# Patient Record
Sex: Male | Born: 1970 | Race: Black or African American | Hispanic: No | Marital: Married
Health system: Southern US, Community
[De-identification: ages and names within clinical notes are randomized; demographics above are authoritative.]

---

## 2005-02-19 ENCOUNTER — Emergency Department: Payer: Self-pay | Admitting: Emergency Medicine

## 2005-12-25 ENCOUNTER — Ambulatory Visit: Payer: Self-pay | Admitting: Internal Medicine

## 2006-06-23 ENCOUNTER — Ambulatory Visit: Payer: Self-pay | Admitting: Internal Medicine

## 2006-08-18 ENCOUNTER — Ambulatory Visit: Payer: Self-pay | Admitting: Internal Medicine

## 2006-08-27 ENCOUNTER — Ambulatory Visit: Payer: Self-pay | Admitting: Gastroenterology

## 2006-09-09 ENCOUNTER — Ambulatory Visit: Payer: Self-pay | Admitting: Gastroenterology

## 2006-09-09 ENCOUNTER — Encounter: Payer: Self-pay | Admitting: Internal Medicine

## 2006-10-05 ENCOUNTER — Emergency Department (HOSPITAL_COMMUNITY): Admission: EM | Admit: 2006-10-05 | Discharge: 2006-10-05 | Payer: Self-pay | Admitting: *Deleted

## 2006-11-20 ENCOUNTER — Encounter: Payer: Self-pay | Admitting: Internal Medicine

## 2007-09-01 ENCOUNTER — Ambulatory Visit: Payer: Self-pay | Admitting: *Deleted

## 2007-09-01 ENCOUNTER — Inpatient Hospital Stay (HOSPITAL_COMMUNITY): Admission: AD | Admit: 2007-09-01 | Discharge: 2007-09-07 | Payer: Self-pay | Admitting: *Deleted

## 2007-09-01 ENCOUNTER — Emergency Department (HOSPITAL_COMMUNITY): Admission: EM | Admit: 2007-09-01 | Discharge: 2007-09-01 | Payer: Self-pay | Admitting: Emergency Medicine

## 2007-09-08 ENCOUNTER — Other Ambulatory Visit (HOSPITAL_COMMUNITY): Admission: RE | Admit: 2007-09-08 | Discharge: 2007-11-09 | Payer: Self-pay | Admitting: Psychiatry

## 2007-09-09 ENCOUNTER — Ambulatory Visit: Payer: Self-pay | Admitting: Psychiatry

## 2007-09-16 ENCOUNTER — Ambulatory Visit: Payer: Self-pay | Admitting: Psychiatry

## 2007-10-24 ENCOUNTER — Encounter: Payer: Self-pay | Admitting: Internal Medicine

## 2007-10-25 ENCOUNTER — Ambulatory Visit: Payer: Self-pay | Admitting: Internal Medicine

## 2007-11-05 ENCOUNTER — Ambulatory Visit: Payer: Self-pay | Admitting: Internal Medicine

## 2007-11-05 LAB — CONVERTED CEMR LAB
Bilirubin Urine: NEGATIVE
Ketones, urine, test strip: NEGATIVE
Nitrite: NEGATIVE
Specific Gravity, Urine: >=1.03
WBC Urine, dipstick: NEGATIVE

## 2007-11-17 ENCOUNTER — Telehealth (INDEPENDENT_AMBULATORY_CARE_PROVIDER_SITE_OTHER): Payer: Self-pay | Admitting: *Deleted

## 2008-05-10 ENCOUNTER — Ambulatory Visit: Payer: Self-pay | Admitting: Internal Medicine

## 2008-05-10 DIAGNOSIS — K648 Other hemorrhoids: Secondary | ICD-10-CM

## 2008-05-15 ENCOUNTER — Encounter (INDEPENDENT_AMBULATORY_CARE_PROVIDER_SITE_OTHER): Payer: Self-pay | Admitting: *Deleted

## 2008-06-02 ENCOUNTER — Encounter: Payer: Self-pay | Admitting: Internal Medicine

## 2008-06-29 ENCOUNTER — Ambulatory Visit (HOSPITAL_COMMUNITY): Admission: RE | Admit: 2008-06-29 | Discharge: 2008-06-29 | Payer: Self-pay | Admitting: Surgery

## 2008-06-29 ENCOUNTER — Encounter (INDEPENDENT_AMBULATORY_CARE_PROVIDER_SITE_OTHER): Payer: Self-pay | Admitting: Surgery

## 2009-05-01 ENCOUNTER — Telehealth: Payer: Self-pay | Admitting: Internal Medicine

## 2009-05-21 IMAGING — CR DG ABDOMEN ACUTE W/ 1V CHEST
4 series · 4 of 4 positions shown · non-contrast
Comparison: none

CLINICAL DATA: Abdominal pain at umbilicus.  
 ACUTE ABDOMEN WITH CHEST ? 4 VIEW:

[w chest pa *]
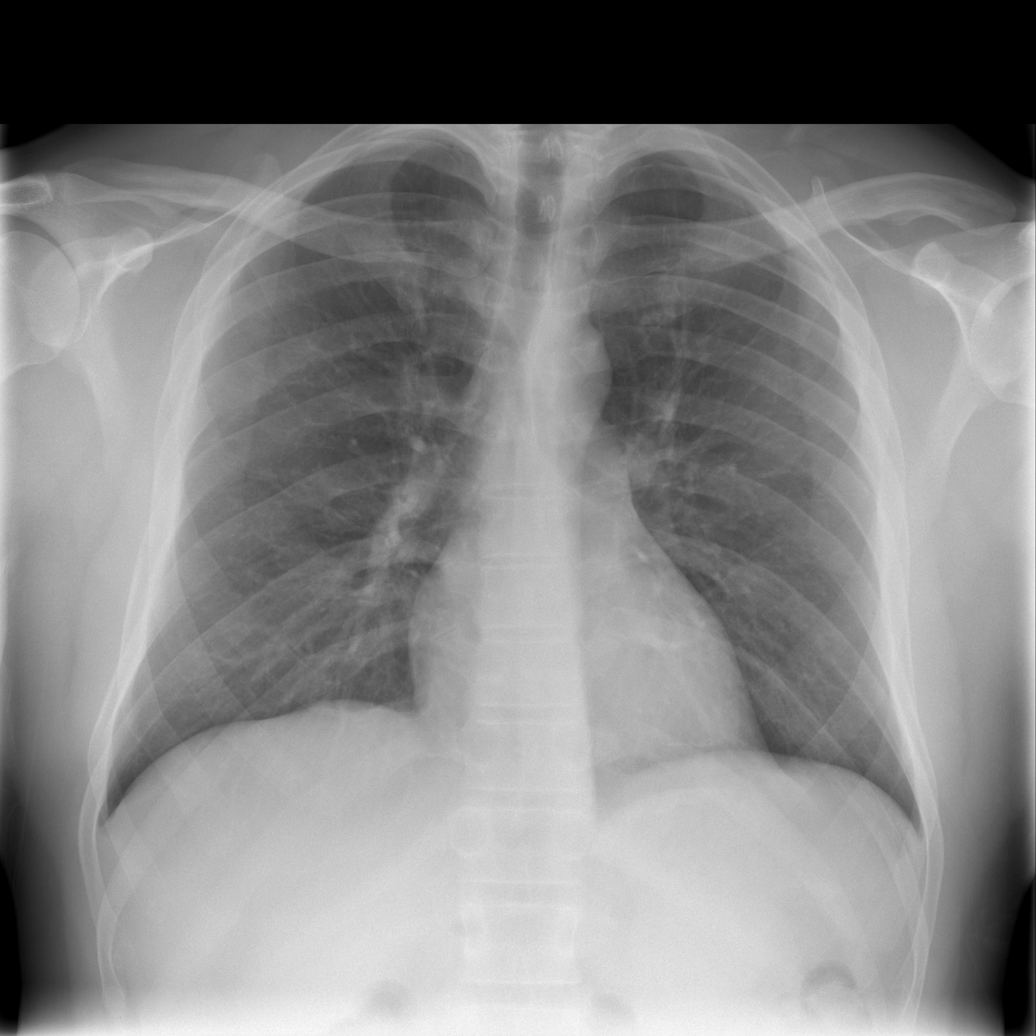

[w abdomen upright *]
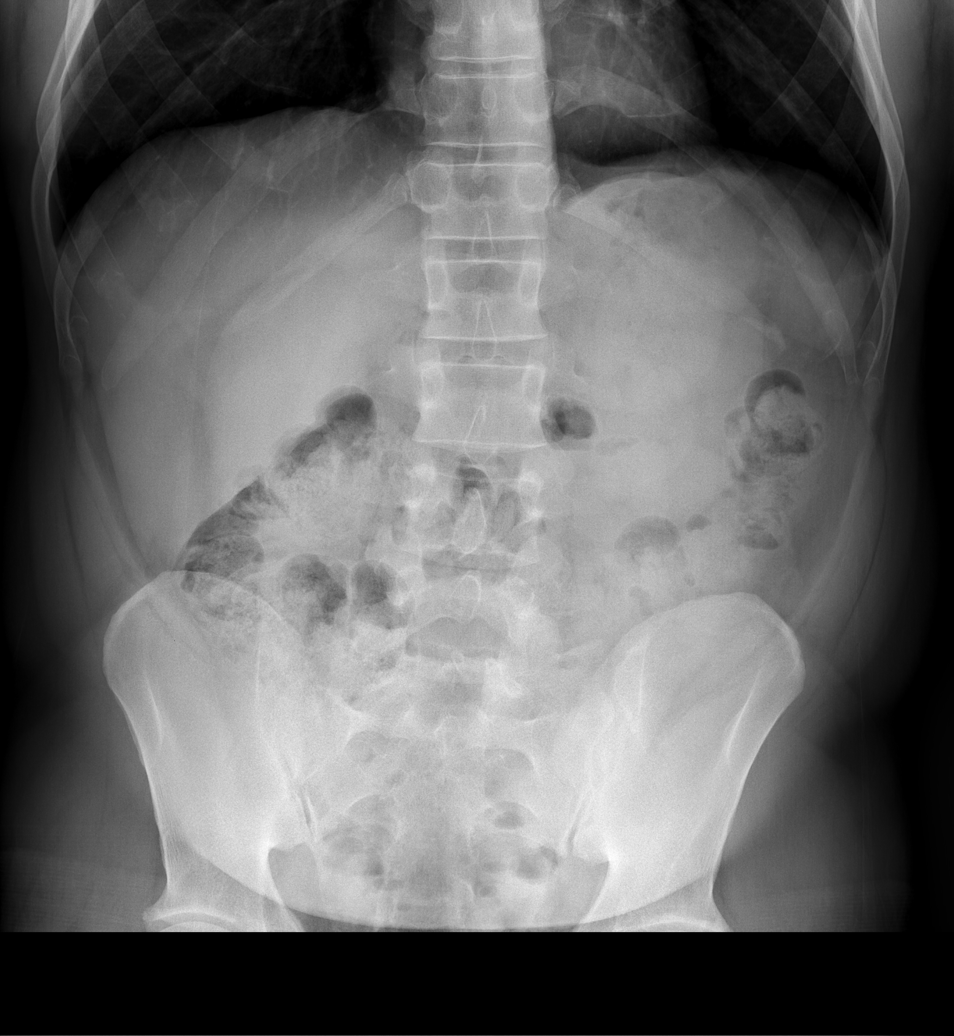

[t abdomen supine (1 of 2)]
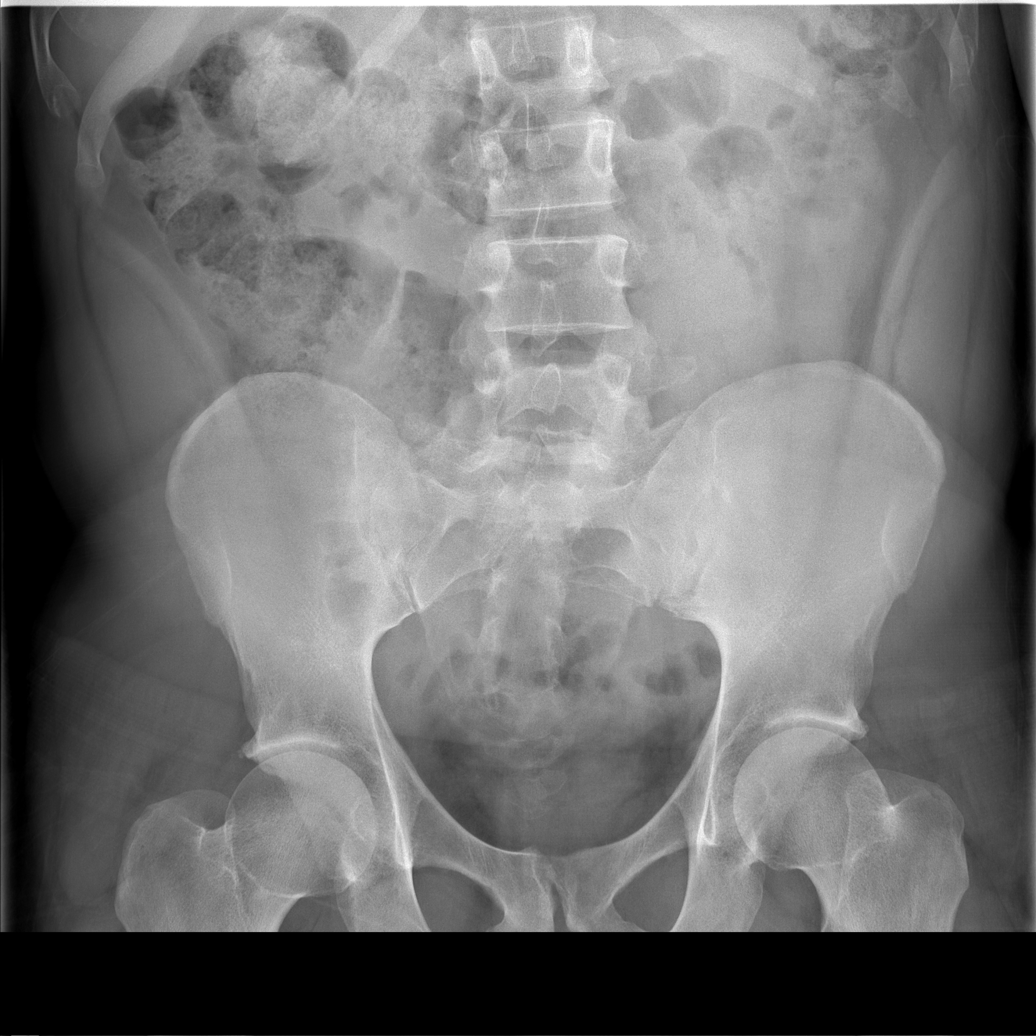

[t abdomen supine (2 of 2)]
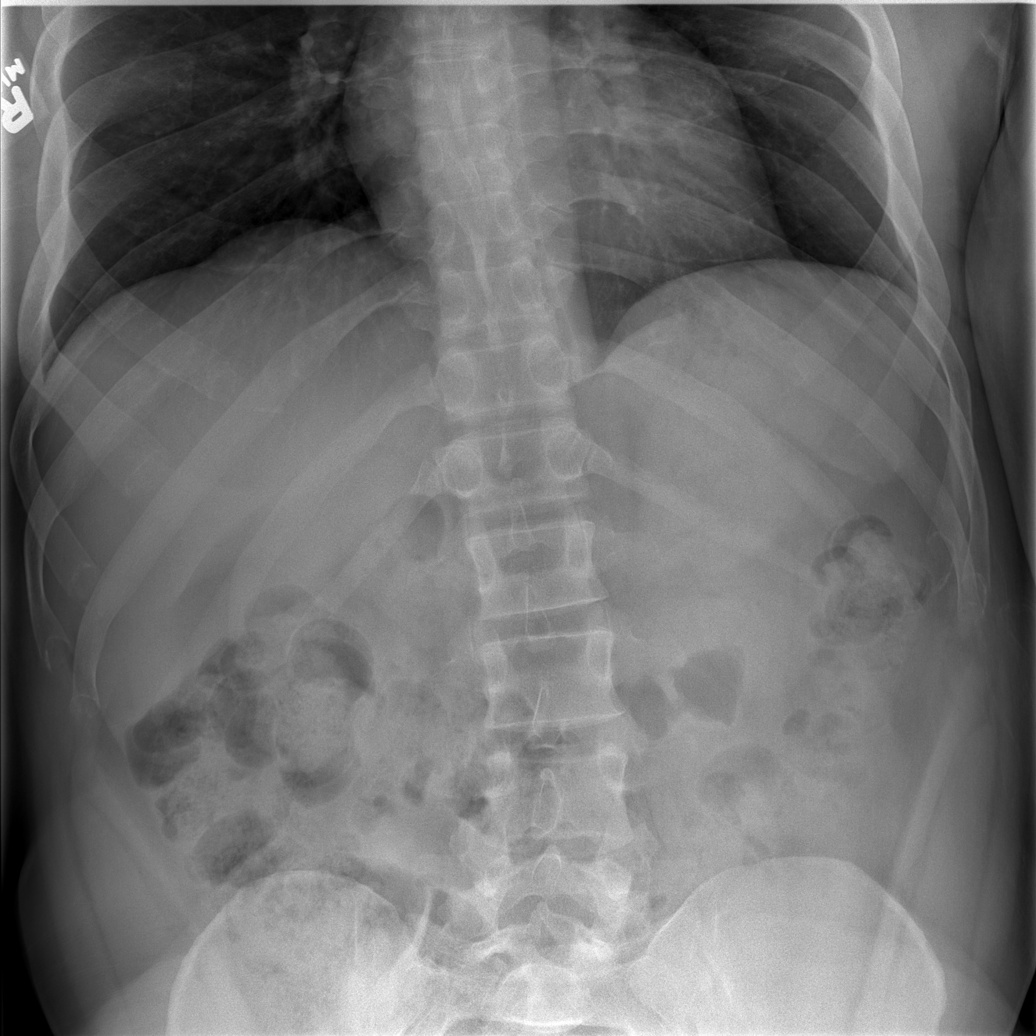

[4 of 4 positions shown; findings below may reference images not displayed]

FINDINGS: Normal chest.   
 There is a large amount of stool in the ascending and transverse colon.  Negative for bowel obstruction or ileus.   The properitoneal fat stripes are defined.
IMPRESSION: Constipation. No acute chest or abdominal disease.

## 2009-05-28 ENCOUNTER — Ambulatory Visit: Payer: Self-pay | Admitting: Internal Medicine

## 2009-05-30 LAB — CONVERTED CEMR LAB
AST: 21 units/L (ref 0–37)
BUN: 5 mg/dL — ABNORMAL LOW (ref 6–23)
Basophils Relative: 0.2 % (ref 0.0–3.0)
CO2: 27 meq/L (ref 19–32)
Chloride: 107 meq/L (ref 96–112)
Cholesterol: 154 mg/dL (ref 0–200)
Creatinine, Ser: 1.1 mg/dL (ref 0.4–1.5)
Eosinophils Absolute: 0.1 10*3/uL (ref 0.0–0.7)
Eosinophils Relative: 1.9 % (ref 0.0–5.0)
Glucose, Bld: 83 mg/dL (ref 70–99)
Hemoglobin: 12.8 g/dL — ABNORMAL LOW (ref 13.0–17.0)
Iron: 133 ug/dL (ref 42–165)
LDL Cholesterol: 88 mg/dL (ref 0–99)
Lymphocytes Relative: 30.7 % (ref 12.0–46.0)
MCHC: 32.1 g/dL (ref 30.0–36.0)
Monocytes Relative: 10.5 % (ref 3.0–12.0)
Neutro Abs: 2.4 10*3/uL (ref 1.4–7.7)
Neutrophils Relative %: 56.7 % (ref 43.0–77.0)
Potassium: 4.3 meq/L (ref 3.5–5.1)
RBC: 4.13 M/uL — ABNORMAL LOW (ref 4.22–5.81)
TSH: 1.19 microintl units/mL (ref 0.35–5.50)
VLDL: 10.4 mg/dL (ref 0.0–40.0)
WBC: 4.3 10*3/uL — ABNORMAL LOW (ref 4.5–10.5)

## 2009-06-11 ENCOUNTER — Ambulatory Visit: Payer: Self-pay | Admitting: Internal Medicine

## 2009-06-11 DIAGNOSIS — F319 Bipolar disorder, unspecified: Secondary | ICD-10-CM

## 2009-06-13 LAB — CONVERTED CEMR LAB

## 2010-05-30 ENCOUNTER — Inpatient Hospital Stay: Payer: Self-pay | Admitting: Psychiatry

## 2010-06-11 NOTE — Miscellaneous (Signed)
Summary: Waiver of Liability/[place] [employer]  Waiver of Liability/[facility]   Imported By: Lanelle Bal 06/15/2009 14:02:39  _____________________________________________________________________  External Attachment:    Type:   Image     Comment:   External Document

## 2010-06-11 NOTE — Assessment & Plan Note (Signed)
Summary: CPX,LABS PRIOR,MEDICARE/RH......   Vital Signs:  Patient profile:   40 year old male Height:      75 inches Weight:      245.4 pounds BMI:     30.78 Pulse rate:   76 / minute BP sitting:   110 / 70  Vitals Entered By: Shary Decamp (June 11, 2009 2:20 PM)   History of Present Illness: CPX, here w/  wife  pt report in the past got tested + for herpes wife has herpes as well w/ occasionall  outbrake , patient never has outbtakes   Current Medications (verified): 1)  Depakote 500 Mg Tbec (Divalproex Sodium) .... 2 By Mouth Once Daily 2)  Trilafon 4mg  .... 1 By Mouth Once Daily  Allergies (verified): No Known Drug Allergies  Past History:  Past Medical History: Bipolar (Goes to [facility]) History of depression. Question of ADD. 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  He is status post left cochlear implant with good     results. The patient is on disability secondary to depression and deafness. ETOH issues in the past; at present, he drinks alcohol "in moderation."   Past Surgical History: left cochlear implant 2002 Vasectomy hemorrhoidectomy 2010  Family History: Reviewed history from 11/05/2007 and no changes required. CAD - no HTN - M family stroke - no DM - sister colon ca - no prostate Ca - uncle, F?  Social History: Reviewed history from 11/05/2007 and no changes required. Married 2 kids disability tobacco-- > 1ppd marihuana-- daily ETOH-- h/o abuse, drink on weekends  exercise-- no diet--not healthy    Review of Systems General:  Denies fatigue, fever, and weight loss. CV:  Denies chest pain or discomfort and swelling of feet. Resp:  Denies cough and shortness of breath. GI:  Denies bloody stools, diarrhea, nausea, and vomiting. MS:  back pain , "from the neck down".  Physical Exam  General:  alert, well-developed, and well-nourished.   Neck:  no thyromegaly.     Lungs:  normal respiratory effort, no intercostal retractions, no accessory muscle use, and normal breath sounds.   Heart:  normal rate, regular rhythm, no murmur, and no gallop.   Abdomen:  soft, non-tender, no distention, no masses, and no guarding.   Extremities:  no pretibial edema bilaterally  Neurologic:  alert & oriented X3 and gait normal.   Psych:  Oriented X3, not anxious appearing, and not depressed appearing.  Flat affect   Impression & Recommendations:  Problem # 1:  ROUTINE GENERAL MEDICAL EXAM@HEALTH  CARE FACL (ICD-V70.0)  Tetanus/Td:  per pt (05/12/2004) colonoscopy May 2008: External hemorrhoids otherwise normal printed material provided regards diet, quitting tobacco counseled against marihuna rec. exercise  also see HPI, VALTREX? no need for valtrex unless he start having outbrakes all labs discussed   Orders: Venipuncture (47829) T-HIV Antibody  (Reflex) ([id number])  Complete Medication List: 1)  Depakote 500 Mg Tbec (Divalproex sodium) .... 2 by mouth once daily 2)  Trilafon 4mg   .... 1 by mouth once daily  Patient Instructions: 1)  Please schedule a follow-up appointment in 1 year.    Immunization History:  Tetanus/Td Immunization History:    Tetanus/Td:  per pt (05/12/2004)

## 2010-06-11 NOTE — Procedures (Signed)
Summary: COLONOSCOPY  COLONOSCOPY   Imported By: Freddy Jaksch 06/12/2009 09:22:06  _____________________________________________________________________  External Attachment:    Type:   Image     Comment:   COLONOSCOPY

## 2010-08-27 LAB — CBC
HCT: 41.1 % (ref 39.0–52.0)
Hemoglobin: 13.8 g/dL (ref 13.0–17.0)
Platelets: 230 10*3/uL (ref 150–400)
RDW: 12.1 % (ref 11.5–15.5)
WBC: 5.8 10*3/uL (ref 4.0–10.5)

## 2010-08-27 LAB — BASIC METABOLIC PANEL
Calcium: 9 mg/dL (ref 8.4–10.5)
GFR calc non Af Amer: >60 mL/min (ref >60)
Glucose, Bld: 93 mg/dL (ref 70–99)
Potassium: 4.4 mEq/L (ref 3.5–5.1)
Sodium: 136 mEq/L (ref 135–145)

## 2010-08-27 LAB — DIFFERENTIAL
Basophils Absolute: 0 10*3/uL (ref 0.0–0.1)
Lymphocytes Relative: 22 % (ref 12–46)
Lymphs Abs: 1.3 10*3/uL (ref 0.7–4.0)
Monocytes Absolute: 0.3 10*3/uL (ref 0.1–1.0)
Neutro Abs: 4.1 10*3/uL (ref 1.7–7.7)

## 2010-09-24 NOTE — Discharge Summary (Signed)
Jason Pennington, Jason NO.:  Pennington   MEDICAL RECORD NO.:  [id number]          PATIENT TYPE:  IPS   LOCATION:  0306                          [facility]ILITY[facility]  [facility]   PHYSICIAN:  Jasmine Pang, M.D. DATE OF BIRTH:  1970/11/15   DATE OF ADMISSION:  09/01/2007  DATE OF DISCHARGE:  09/07/2007                               DISCHARGE SUMMARY   IDENTIFICATION:  This is a 40 year old married African American male  from [place].   HISTORY OF PRESENT ILLNESS:  The patient states today that I am an  alcoholic.  He states he has been suffering from depression.  The  patient states that I am an alcoholic.  He states he began drinking to  stop his depression.  He states that he drinks alone.  He has been  experiencing blackouts.  He has been thinking about suicide, but states  that he is not man enough to kill himself.  He does think about  shooting at a police been so they will go ahead and shoot him instead.  He also smokes marijuana.  He denies any hallucinations and is here to  get help.   PAST PSYCHIATRIC HISTORY:  This is the first admission to [facility].  He has a therapist named Berniece Salines in [place].  He has been in prior rehab programs.  He has been on Lexapro, Strattera,  Risperdal.   FAMILY HISTORY:  Family member who committed suicide.  He did not  disclose, which family member this is.   ALCOHOL AND DRUG HISTORY:  The patient denies any history of seizures.  He has been using marijuana and has tried crack cocaine in the past.  He  uses alcohol as indicated above.   MEDICAL PROBLEMS:  The patient has been deaf since the age of 29.  He  did receive a cochlear implant.  He denies any other medical issues.   MEDICATIONS:  No current medications.   DRUG ALLERGIES:  No known drug allergies.   PHYSICAL FINDINGS:  The patient was fully assessed at [facility]  Emergency Department.  There were no acute physical or medical problems   noted.   LABORATORY DATA:  Alcohol level was less than 5.  CMET was within normal  limits.  CBC was within normal limits.  Urine drug screen was positive  for THC.   HOSPITAL COURSE:  Upon admission, the patient was started on Librium  detox protocol.  He was also started on Ambien 10 mg p.o. q.h.s. p.r.n.  insomnia.  The patient tolerated these meds well with no significant  side effects.  In individual sessions with me, the patient was reserved,  but cooperative.  He also participated appropriately in unit therapeutic  groups and activities.  His therapeutic issues revolved around his  losing his hearing and the resultant depression that occurred afterwards  and alcohol dependence.  He admitted using marijuana and has used crack  cocaine.  As hospitalization progressed initially, the patient was  irritable.  He was upset when he found out this is only a short-term  stay.  He wanted to stay in the hospital for a long period of time.  He  continued to blame people like the case manager for not helping him,  because he could not stay for a long period of time.  As hospitalization  progressed, he became less irritable, less anxious, less depressed.  His  sleep was good and appetite was good.  He had a family session with his  wife.  She was very supportive and will do all she can do assist him  with any program and he come back to live at home while being in a  program.  The patient's wife visited frequently and was very supportive.  The patient began to feel ready to go home.  He was detoxing well  without side effects.  Sleep was good.  Appetite was good.  It was  planned that he would start the CDIOP program.  On September 07, 2007,  mental status had improved markedly from admission status.  The patient  was less depressed, less anxious and he was not suicidal.  There were no  auditory or visual hallucinations.  He is not homicidal.  No thoughts of  self-injurious behavior.  No paranoia  or delusions.  Thoughts were  logical and goal-directed.  Thought content, no predominant theme.  Cognitive was grossly intact.  It was felt the patient was able to be  discharged safely.  He will attend the CDIOP starting the following day.   DISCHARGE DIAGNOSES:  Axis I:  Alcohol dependence, marijuana abuse,  polysubstance abuse, depressive disorder, not otherwise specified.  Axis II:  None.  Axis III:  The patient is deaf.  Axis IV:  Psychosocial problems include medical problems.  Axis V:  Global assessment of functioning upon discharge was 50.  GAF  upon admission was 35.  GAF highest past year was 60-65.   DISCHARGE PLAN:  There were no specific activity level or dietary  restrictions.   POSTHOSPITAL CARE PLANS:  The patient will start the CDIOP program on  Wednesday September 08, 2007, at 10 o'clock a.m.  He is planning to go an AA  meeting today.  He plans to attend regular AA meetings.   DISCHARGE MEDICATIONS:  None.      Jasmine Pang, M.D.  Electronically Signed     BHS/MEDQ  D:  10/09/2007  T:  10/09/2007  Job:  [id number]

## 2010-09-24 NOTE — Op Note (Signed)
NAMERHYS, LICHTY                ACCOUNT NO.:  [id number]   MEDICAL RECORD NO.:  [id number]          PATIENT TYPE:  AMB   LOCATION:  DAY                          FACILITY:  [facility]   PHYSICIAN:  Wilmon Arms. Corliss Skains, M.D. DATE OF BIRTH:  December 20, 1970   DATE OF PROCEDURE:  06/29/2008  DATE OF DISCHARGE:                               OPERATIVE REPORT   PREOPERATIVE DIAGNOSIS:  Prolapsed internal hemorrhoids.   POSTOPERATIVE DIAGNOSIS:  Prolapsed internal hemorrhoids.   PROCEDURE PERFORMED:  Procedure for prolapse and hemorrhoids (stapled  hemorrhoid pexy).   SURGEON:  Wilmon Arms. Tsuei, M.D.   ANESTHESIA:  General.   INDICATIONS:  The patient is a 40 year old male who has had longstanding  problems with prolapsing internal hemorrhoids.  He had previously had  injections with a sclerosing agent but this has recurred.  He has  definite prolapse but no sign of external hemorrhoidal disease.  The  patient denies any constipation.  He presents now for a PPH procedure.   DESCRIPTION OF PROCEDURE:  The patient was brought to the operating  room, placed in a supine position on a stretcher.  After an adequate  level of general anesthesia was obtained, he was positioned in a prone  position with the table jack-knifed.  His buttocks were taped apart.  His perianal and perineal region were prepped with Betadine and draped  in a sterile fashion.  A time-out was taken to ensure the proper  patient, proper procedure.  We infiltrated the perianal region with  0.25% Marcaine with Wydase.  We dilated the anal sphincter up to 3  fingers.  The [facility] kit was opened.  We used the dilator and held it in  place for several minutes to provide relaxation.  We then inserted a  clear retractor and this was sutured in place with 2-0 nylon sutures.  The patient has a significant amount of prolapsed internal hemorrhoidal  tissue.  This comes down fairly low, close to the dentate line.  He has  no external hemorrhoidal  disease.  The working retractor was then  inserted.  We placed a pursestring suture of 2-0 Prolene  circumferentially.  We placed this inside about 2 cm so we could be sure  to get all of the internal hemorrhoidal tissue.  We made certain not to  bring our pursestring suture too close to the dentate line.  Once the  pursestring suture was in place, the [facility] stapler was inserted.  The  anvil was placed above the pursestring suture and then suture was tied  down.  The tails of the suture, we brought to the sides of the stapler,  and a loop was tied.  We then closed down the stapler, held it in place  for a couple minutes for hemostasis.  Again, we made sure that the  stapler did not impinge on the dentate line.  The stapler was then  fired, was opened slightly, and then was removed.  A complete ring of  hemorrhoidal tissue was identified and was sent for pathologic  examination.  We inspected the staple line  circumferentially.  No bleeding was noted.  We were above the dentate  line circumferentially.  Gelfoam was packed into the anal canal.  The  patient was then extubated and brought to recovery in stable condition.  All sponge, instrument, and needle counts were correct.      Wilmon Arms. Tsuei, M.D.  Electronically Signed     MKT/MEDQ  D:  06/29/2008  T:  06/29/2008  Job:  [id number]

## 2010-09-24 NOTE — H&P (Signed)
Pennington, Jason                ACCOUNT NO.:  [id number]   MEDICAL RECORD NO.:  [id number]          PATIENT TYPE:  IPS   LOCATION:  0306                          [facility]  [facility]   PHYSICIAN:  Jasmine Pang, M.D. DATE OF BIRTH:  11/09/1970   DATE OF ADMISSION:  09/01/2007  DATE OF DISCHARGE:                       PSYCHIATRIC ADMISSION ASSESSMENT   HISTORY OF PRESENT ILLNESS:  The patient states today that I am an  alcoholic.  He states he has been suffering from depression.  The  patient states that his alcohol use had really increased since he became  deaf at the age of 22.  He states he began drinking to stop his  depression.  He states that alcohol has become his friend.  He drinks  alone.  He has been experiencing blackouts.  He states that he has been  thinking about suicide but states that he is not man enough to kill  himself.  He does think about to shoot at a policeman so that they will  go ahead shoot him instead.  He also smokes marijuana.  He denies any  hallucinations and is here to get help.   PAST PSYCHIATRIC HISTORY:  First admission to [facility].  He has a therapist named Berniece Salines in [place].  He has been in  prior rehab programs and has been on Lexapro, Strattera, and Risperdal.   SOCIAL HISTORY:  He is married male.  He has two daughters ages 45 and  85.  He works at [employer].   FAMILY HISTORY:  Family member who had committed suicide.  He did not  disclose which family member that was.   ALCOHOL AND DRUG HISTORY:  The patient denies any history of seizures.  He has been using marijuana and has tried crack cocaine in the past.   PRIMARY CARE Myrikal Messmer:  Is unknown.   MEDICAL PROBLEMS:  Has been deaf since the age of 23.  Did receive a  cochlear implant.  He denies any other medical issues.   MEDICATIONS:  No current medications.   DRUG ALLERGIES:  No known allergies.   PHYSICAL EXAMINATION:  The patient was fully assessed at  [facility]  emergency department.  This is a tall, well-nourished, well-developed  male.  Temperature 97.5, 70 heart rate, 18 respirations, blood pressure  is 142/80.  He is 233 pounds.  He is 6 feet 5 inches tall.   His laboratory data:  Alcohol level less than 5.  CMET within normal  limits.  CBC within normal limits.  Urine drug screen is positive for  THC.   MENTAL STATUS EXAM:  This is again a cooperative male, appropriately  dressed.  The patient indicates that we need to speak directly in front  of him so to help read our lips.   LABORATORY DATA:  He has good eye contact.  His speech is clear, normal  pace and tone.  The patient's mood is depressed and anxious.  The  patient appears anxious.  He did become teary-eyed a couple times  throughout the interview.  Thought process are coherent.  No evidence of  any delusional thinking.  Cognitive function intact.  His memory is  good.  Judgment and insight is fair.   AXIS I:  Alcohol dependence.  Marijuana abuse.  Depressive disorder, not  otherwise specified. AXIS II:  Deferred.  AXIS III:  The patient is deaf.  AXIS IV:  Psychosocial problems.  Medical problems.  AXIS V:  Current is 35-40.   PLAN:  To detox patient with Librium protocol, relapse prevention.  We  will assess an antidepressant that may be of value.  Look at any  potential rehab programs.  The patient indicates that he wants to get  whatever help he needs to stay sober.  Consider family session with his  wife.  Will have trazodone for sleep.  The patient is encouraged to go  to groups.  His tentative length of stay is 4 to 6 days.      Landry Corporal, N.P.      Jasmine Pang, M.D.  Electronically Signed    JO/MEDQ  D:  09/02/2007  T:  09/02/2007  Job:  [id number]

## 2010-09-27 NOTE — Assessment & Plan Note (Signed)
[facility]                          [facility] OFFICE NOTE   NAME:Jason Pennington, Jason Pennington                         MRN:          [id number]  DATE:12/25/2005                            DOB:          1970-06-04    CHIEF COMPLAINT:  Complete physical.   HISTORY OF PRESENT ILLNESS:  [name] is a 40 year old African American  male who came to the office to get established and get a complete physical.  In general, he is doing well.  He is just concerned about his general  health.   PAST MEDICAL HISTORY:  1. History of depression.  2. Question of ADD.  3. At age 17, he started to lose his hearing.  By age 74, he was      completely deaf.  He is status post left cochlear implant with good      results.  4. The patient is on disability secondary to depression and deafness.  5. The patient admits to having issues with alcohol in the past.  At      present, he drinks alcohol in moderation.   FAMILY HISTORY:  Most of his family history is unknown.  However, he does  know that an uncle had prostate cancer and, possibly, his father also had  prostate cancer.   SOCIAL HISTORY:  The patient smokes less than a pack a day of tobacco.  He  smokes marijuana almost daily.  Again, he still drinks alcohol but not as  much as before.  He is married and has two children.   REVIEW OF SYSTEMS:  He denies any chest pain or shortness of breath.  He  admits to some cough and wheezing from time to time but denies any sputum  production or hemoptysis.  He does have occasional nausea, mostly when he  drinks alcohol, but no diarrhea.  As far as diet and exercise, he does none  of that.   MEDICATIONS:  1. Lexapro.  2. Risperdal.  3. Estratera.  4. Multivitamins.   All the exact prescriptions prescribed at ____________ [facility] in  [place].   ALLERGIES:  No known drug allergies.   PHYSICAL EXAMINATION:  GENERAL:  The patient is alert and oriented, not in  distress.  VITAL SIGNS:  He weighs 241.2 pounds.  Blood pressure 120/80, pulse 60,  respirations 16.  HEENT:  He has a visible cochlear implant on the left.  NECK:  No thyromegaly.  LUNGS:  Clear to auscultation bilaterally.  CARDIOVASCULAR:  Regular rate and rhythm without murmur.  ABDOMEN:  Not distended, soft.  Good bowel sounds.  No organomegaly.  EXTREMITIES:  No edema.  RECTAL:  Digital rectal exam showed normal prostate.  NEUROLOGIC:  Speech, gait and motor are intact.   ASSESSMENT/PLAN:  1. He is here for a complete physical.  2. I discussed with the patient that diet and exercise are extremely      important to keep him healthy.  I gave him some information about low-      fat eating and encouraged him to gradually start running.  3. Will check CBC,  comprehensive panel, fasting lipid profile, TSH, and      because of his family history, will check a PSA.  4. He was counseled about alcohol, tobacco, and marijuana.  I explained to      him that all of these are hazards to his health.  He stated that he      knows all this and he is actively working with his counselor in      [place] to quit these drugs and alcohol.  5. Office visit once a year plus as needed.                                   Willow Ora, MD   JP/MedQ  DD:  12/25/2005  DT:  12/25/2005  Job #:  [id number]

## 2010-09-27 NOTE — Assessment & Plan Note (Signed)
[facility]                         GASTROENTEROLOGY OFFICE NOTE   NAME:Jason Pennington, Bovenzi                         MRN:          [id number]  DATE:08/27/2006                            DOB:          09-Dec-1970    [name] is a 40 year old black male who is disabled because of his  chronic deafness.  He was referred by Dr. Drue Novel for evaluation of rectal  bleeding.   [name] has had intermittent rectal bleeding for the last several  years, and has known external hemorrhoids.  Mostly has bright red blood  per rectum.  He has no rectal or abdominal pain, or other  gastrointestinal symptoms.  He recently was seen by Dr. Drue Novel and was  placed on Metamucil and twice a day sitz baths with p.r.n. local anal  care.  He continues to have problems with intermittent rectal bleeding.  He has never had barium enemas or colonoscopy exams.  He has no history  of anemia or any other chronic gastrointestinal difficulties.  He  follows a regular diet and denies any specific food intolerances.   PAST MEDICAL HISTORY:  Remarkable for mild depression, cochlear  implantation, and vasectomy.   MEDICATIONS:  .  1. Lexapro 10 mg a day.  2. Strattera 120 mg a day.  3. Risperdal 1 mg in the morning and 2 at bedtime.  4. A variety of multivitamins.  5. Daily Metamucil.   Denies drug allergies.   FAMILY HISTORY:  Remarkable for prostate cancer in several family  members, but no known gastrointestinal difficulties.   SOCIAL HISTORY:  He is married and lives with his wife, and has 2  children.  He has a high school education.  He smokes a few cigarettes a  day and uses ethanol socially.   REVIEW OF SYSTEMS:  Noncontributory, except for his deafness, which is  greatly relieved by cochlear implant.   EXAMINATION:  He seems to be a healthy-appearing black male appearing  his stated age, in no acute distress.  I cannot appreciate stigmata of  chronic liver disease.  ABDOMEN:   Entirely benign without organomegaly, masses, or tenderness.  Bowel sounds were normal.  RECTAL:  Inspection of the rectum did show some external hemorrhoids  with superficial fissure posteriorly.  I did not repeat rectal exam at  this time.   ASSESSMENT:  [name] most likely has hemorrhoidal bleeding, but needs  colonoscopy exam to exclude an underlying colonic polyposis.   RECOMMENDATIONS:  1. Outpatient colonoscopy at his convenience.  2. Information concerning hemorrhoids and their management.  3. Twice a day sitz baths with Xyralid cleansing lotion followed by      Analpram 0.5% cream twice a day.  4. Further treatment depending on his clinical course and colonoscopy      results.     Vania Rea. Jarold Motto, MD, Caleen Essex, FAGA  Electronically Signed    DRP/MedQ  DD: 08/27/2006  DT: 08/27/2006  Job #: [id number]   cc:   Willow Ora, MD

## 2011-02-04 LAB — URINE DRUGS OF ABUSE SCREEN W ALC, ROUTINE (REF LAB)
Barbiturate Quant, Ur: NEGATIVE
Benzodiazepines.: NEGATIVE
Ethyl Alcohol: <5
Marijuana Metabolite: NEGATIVE
Methadone: NEGATIVE
Propoxyphene: NEGATIVE

## 2011-02-04 LAB — CBC
HCT: 42.1
MCV: 91.8
Platelets: 239
RDW: 12.6

## 2011-02-04 LAB — RAPID URINE DRUG SCREEN, HOSP PERFORMED
Amphetamines: NOT DETECTED
Barbiturates: NOT DETECTED
Benzodiazepines: NOT DETECTED
Cocaine: NOT DETECTED

## 2011-02-04 LAB — COMPREHENSIVE METABOLIC PANEL
AST: 31
Albumin: 4.6
BUN: 15
Calcium: 9.4
Creatinine, Ser: 1.29
GFR calc Af Amer: >60
Total Protein: 8

## 2011-02-04 LAB — DIFFERENTIAL
Basophils Absolute: 0
Lymphocytes Relative: 30
Lymphs Abs: 1.9
Monocytes Absolute: 0.8
Monocytes Relative: 12
Neutro Abs: 3.6

## 2011-02-05 LAB — URINE DRUGS OF ABUSE SCREEN W ALC, ROUTINE (REF LAB)
Amphetamine Screen, Ur: NEGATIVE
Barbiturate Quant, Ur: NEGATIVE
Creatinine,U: 145.2
Ethyl Alcohol: <10
Opiate Screen, Urine: NEGATIVE
Propoxyphene: NEGATIVE

## 2011-02-06 LAB — URINE DRUGS OF ABUSE SCREEN W ALC, ROUTINE (REF LAB)
Amphetamine Screen, Ur: NEGATIVE
Amphetamine Screen, Ur: NEGATIVE
Amphetamine Screen, Ur: NEGATIVE
Cocaine Metabolites: NEGATIVE
Cocaine Metabolites: NEGATIVE
Cocaine Metabolites: NEGATIVE
Cocaine Metabolites: NEGATIVE
Creatinine,U: 245.8
Creatinine,U: 90.4
Creatinine,U: 152.6
Creatinine,U: 212.1
Marijuana Metabolite: POSITIVE — AB
Methadone: NEGATIVE
Opiate Screen, Urine: NEGATIVE
Opiate Screen, Urine: NEGATIVE
Opiate Screen, Urine: NEGATIVE
Opiate Screen, Urine: NEGATIVE

## 2012-05-13 ENCOUNTER — Emergency Department: Payer: Self-pay | Admitting: Emergency Medicine

## 2012-05-13 LAB — COMPREHENSIVE METABOLIC PANEL
Alkaline Phosphatase: 64 U/L (ref 50–136)
BUN: 14 mg/dL (ref 7–18)
Bilirubin,Total: 0.6 mg/dL (ref 0.2–1.0)
Calcium, Total: 8.8 mg/dL (ref 8.5–10.1)
Chloride: 105 mmol/L (ref 98–107)
Co2: 26 mmol/L (ref 21–32)
Creatinine: 1.46 mg/dL — ABNORMAL HIGH (ref 0.60–1.30)
EGFR (Non-African Amer.): 59 — ABNORMAL LOW
Glucose: 88 mg/dL (ref 65–99)
Potassium: 3.7 mmol/L (ref 3.5–5.1)
SGPT (ALT): 26 U/L (ref 12–78)
Sodium: 138 mmol/L (ref 136–145)
Total Protein: 8.1 g/dL (ref 6.4–8.2)

## 2012-05-13 LAB — URINALYSIS, COMPLETE
Bilirubin,UR: NEGATIVE
Glucose,UR: NEGATIVE mg/dL (ref 0–75)
Leukocyte Esterase: NEGATIVE
Nitrite: NEGATIVE
Ph: 6 (ref 4.5–8.0)
RBC,UR: 1 /HPF (ref 0–5)
WBC UR: NONE SEEN /HPF (ref 0–5)

## 2012-05-13 LAB — DRUG SCREEN, URINE
Amphetamines, Ur Screen: NEGATIVE (ref ?–1000)
Benzodiazepine, Ur Scrn: NEGATIVE (ref ?–200)
Cannabinoid 50 Ng, Ur ~~LOC~~: POSITIVE (ref ?–50)
Cocaine Metabolite,Ur ~~LOC~~: NEGATIVE (ref ?–300)
Opiate, Ur Screen: NEGATIVE (ref ?–300)
Phencyclidine (PCP) Ur S: NEGATIVE (ref ?–25)
Tricyclic, Ur Screen: NEGATIVE (ref ?–1000)

## 2012-05-13 LAB — CBC
HCT: 43.4 % (ref 40.0–52.0)
HGB: 14.5 g/dL (ref 13.0–18.0)
MCH: 31.3 pg (ref 26.0–34.0)
MCHC: 33.4 g/dL (ref 32.0–36.0)
Platelet: 245 10*3/uL (ref 150–440)
RBC: 4.64 10*6/uL (ref 4.40–5.90)
RDW: 12.8 % (ref 11.5–14.5)

## 2012-05-13 LAB — ACETAMINOPHEN LEVEL: Acetaminophen: <2 ug/mL

## 2012-05-13 LAB — TSH: Thyroid Stimulating Horm: 1.07 u[IU]/mL

## 2014-09-01 NOTE — Consult Note (Signed)
Brief Consult Note: Diagnosis: personality disorder nos.   Patient was seen by consultant.   Consult note dictated.   Discussed with Attending MD.   Comments: Psychiatry: Patient seen. Chart reviewed. Patient currently calm and denies any suicidal ideation and has a lucid plan for self care and outpt follow-up. No need for inpatient treatment. Not currently meeting commitment criteria. Release from ER and refer [facility].  Electronic Signatures: Audery Amellapacs, Bejamin Hackbart T (MD)  (Signed 03-Jan-14 17:42)  Authored: Brief Consult Note   Last Updated: 03-Jan-14 17:42 by Audery Amellapacs, Kelvis Berger T (MD)

## 2014-09-01 NOTE — Consult Note (Signed)
PATIENT NAME:  Jason Pennington, Jason Pennington MR#:  [id number] DATE OF BIRTH:  11/14/1970  DATE OF CONSULTATION:  05/14/2012  REFERRING PHYSICIAN:    CONSULTING PHYSICIAN:  Jason Amel, MD  IDENTIFYING INFORMATION AND REASON FOR CONSULT: A 44 year old man who came to the Emergency Room with reported suicidal ideation. Consultation for evaluation of appropriate psychiatric management.   HISTORY OF PRESENT ILLNESS:  Information obtained from the patient and the chart. The patient came to the Emergency Room stating that he was feeling depressed and reportedly having suicidal ideation, saying that he thought that he would get a policeman to shoot him. He had been saying that his mood had been getting worse, and he had been feeling hopeless and was looking for some kind of treatment. By the time I saw the patient many hours later, he was telling a different story. He told me that he had never actually said that he was suicidal or had any wish to kill himself. He tells me that he has chronic thoughts about wishing that he were dead, but that is a normal part of life for him. When he had been confronted by someone demanding that he describe a way that he could end up being dead, he had come up with the scenario of having a policemen shoot him, but that he had never actually meant to imply that that was something he wanted to do. He tells me that he has been off of medication because he does not have a way to get mental health recently, and has not been compliant with it. He denies that he is having any psychotic symptoms. He admits that he drinks alcohol, although he tends to minimize the degree to which it is a problem for him. He admits that he uses marijuana regularly, although he goes to some lengths to say that he does not think that it is a significant problem for him. He tells me that by the time I am seeing him, he feels like his mood has improved. He definitely does not want to continue to be in the Emergency Room  any longer. He says all he is interested in getting a referral to go see a new mental health provider in town.   PAST PSYCHIATRIC HISTORY:  The patient has a history of substance abuse and gamey suicidal ideation in the past. He previously had followed up regularly with [facility] until they moved. He has not been following up with mental health since then. He has a history of having reported suicidal ideation and paranoid ideation in the past. It is not clear that he has ever actually engaged in any specifically suicidal behavior. He has a history of drinking heavily in the past, and a history of cocaine use and probably has self-mutilated. He has been on several psychiatric medicines in the past including, that he can recall risk, Risperdal, Seroquel, Depakote, Prozac, Lexapro and Strattera. According to him, none of them were really of any benefit to him.   SOCIAL HISTORY: Currently not working. Lives in town. He is separated from his wife and says he has not seen her in about a year. It sounds like his social life is pretty limited. He does get disability.   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 Otherwise, no significant ongoing medical problems.   FAMILY HISTORY: Positive family history for bipolar disorder and substance use.   CURRENT MEDICATIONS: The patient told me he is not currently taking any  medicine. On admission, he told the nurse, apparently, that he had been taking Zoloft or Depakote. When I asked him about this, he denied remembering it.   SUBSTANCE ABUSE HISTORY: Long history of abuse of alcohol, cocaine and marijuana. The patient goes to some trouble to tell me that he does not think that the substance use is a significant factor for him, but that it is really just a symptom of his need to treat his mood problems.   REVIEW OF SYSTEMS: Positive for intermittently depressed mood and anxiety. Fatigue. No suicidal ideation. No psychotic  symptoms. No other specific physical symptoms right now.   MENTAL STATUS EXAMINATION:  Neatly groomed man in hospital pajamas, interviewed in the Emergency Room. Cooperative with the interview. Rather dramatic in his showing us to cooperate with talking with me. Eye contact good. Psychomotor activity rather demonstrative and a little histrionic, not agitated, however. Speech normal rate, tone, and volume. Affect is dramatic and somewhat histrionic. Mood is stated as being depressed, but normal for him. Thoughts are generally lucid. No obvious loosening of associations or delusional content. Denies auditory or visual hallucinations. He says he has chronic wishes that he were dead because life is so bad for him, but that he has no intention of killing himself. He denies any homicidal ideation. Judgment and insight chronically impaired. Intelligence is at least normal, possibly elevated. Short- and long-term memory grossly intact. Alert and oriented x4.   LABORATORY RESULTS: Chemistry showed an elevated creatinine at 1.46. Alcohol undetectable. CBC normal. TSH normal at 1.07. Drug screen positive for cannabis. Urinalysis unremarkable. The patient has a negative chlamydia and gonorrhea test, which had been done at his own request.   ASSESSMENT: A 44 year old man with a history of substance abuse and also dramatic presentations for mood. Self-injury but not suicide attempts in the past. Chronic poor social functioning. Presentation overall seems very much like personality disorder, cluster B type. Does not appear to be manic. Does not appear to be psychotic. Denying any suicidal ideation currently. The patient, at this point, no longer meets commitment criteria and is unlikely to benefit from short-term hospitalization. [facility] and has a place to live.   TREATMENT PLAN: Discussed options for treatment in the community. [facility]  and is agreeable to this. I have discussed the case with the Emergency Room attending, and the patient will be released from involuntary commitment and discharged home. He was counseled again about the effect of substance abuse on his mood and encouraged to try and cut down or discontinue his substance abuse.   DIAGNOSIS, PRINCIPLE AND PRIMARY:  AXIS I: Mood disorder, not otherwise specified.   SECONDARY DIAGNOSES: AXIS I: Polysubstance dependence.  AXIS II: Borderline personality disorder.  AXIS III: Congenital deafness with a cochlear implant.  AXIS IV: Severe from chronic burden of illness and lack of social support.  AXIS V: Functioning at time of evaluation 55.     ____________________________ Jason AmelJohn T. Clapacs, MD [email or web address] D: 05/14/2012 20:34:01 ET T: 05/14/2012 21:06:43 ET JOB#: [id number]  cc: Jason AmelJohn T. Clapacs, MD, <Dictator> Jason AmelJOHN T CLAPACS MD ELECTRONICALLY SIGNED 05/16/2012 0:13
